# Patient Record
Sex: Female | Born: 1970 | Race: Black or African American | Hispanic: No | Marital: Single
Health system: Southern US, Community
[De-identification: ages and names within clinical notes are randomized; demographics above are authoritative.]

---

## 1998-02-07 ENCOUNTER — Emergency Department (HOSPITAL_COMMUNITY): Admission: EM | Admit: 1998-02-07 | Discharge: 1998-02-07 | Payer: Self-pay | Admitting: *Deleted

## 1998-02-09 ENCOUNTER — Emergency Department (HOSPITAL_COMMUNITY): Admission: EM | Admit: 1998-02-09 | Discharge: 1998-02-09 | Payer: Self-pay | Admitting: Emergency Medicine

## 2003-12-22 ENCOUNTER — Emergency Department (HOSPITAL_COMMUNITY): Admission: EM | Admit: 2003-12-22 | Discharge: 2003-12-22 | Payer: Self-pay | Admitting: *Deleted

## 2017-05-08 ENCOUNTER — Ambulatory Visit: Payer: Self-pay | Admitting: Physical Therapy

## 2019-02-22 ENCOUNTER — Other Ambulatory Visit: Payer: Self-pay | Admitting: Family Medicine

## 2019-02-22 DIAGNOSIS — N23 Unspecified renal colic: Secondary | ICD-10-CM

## 2019-02-23 ENCOUNTER — Other Ambulatory Visit: Payer: Self-pay

## 2019-03-03 ENCOUNTER — Other Ambulatory Visit: Payer: Self-pay

## 2019-05-04 ENCOUNTER — Other Ambulatory Visit: Payer: Self-pay

## 2019-05-04 ENCOUNTER — Encounter: Payer: Self-pay | Admitting: Family Medicine

## 2019-05-04 ENCOUNTER — Ambulatory Visit: Payer: Self-pay | Admitting: Sports Medicine

## 2019-05-04 ENCOUNTER — Ambulatory Visit (INDEPENDENT_AMBULATORY_CARE_PROVIDER_SITE_OTHER): Payer: Managed Care, Other (non HMO) | Admitting: Family Medicine

## 2019-05-04 VITALS — BP 124/86 | Ht 63.5 in | Wt 235.0 lb

## 2019-05-04 DIAGNOSIS — M25552 Pain in left hip: Secondary | ICD-10-CM | POA: Diagnosis not present

## 2019-05-04 NOTE — Progress Notes (Signed)
PCP: Lujean Amel, MD  Subjective:   HPI: Patient is a 48 y.o. female here for left hip pain.  Patient reports for about 1 month she's had consistent lateral left hip/gluteal pain. No acute injury or trauma. Sometimes travels below the knee but mainly stays from hip/glute into the thigh area lateral to anterior. Pain can get up to a 7/10 and sharp. Some numbness but occurs in parts and not consistent. advil has helped with this. Has not tried any exercises/stretches for this. No back pain though she's had this in the past. No skin changes.  History reviewed. No pertinent past medical history.  Current Outpatient Medications on File Prior to Visit  Medication Sig Dispense Refill  . Lancets (ONETOUCH DELICA PLUS [id number]) MISC     . metFORMIN (GLUCOPHAGE-XR) 500 MG 24 hr tablet     . tamsulosin (FLOMAX) 0.4 MG CAPS capsule Take 0.4 mg by mouth daily.     No current facility-administered medications on file prior to visit.     History reviewed. No pertinent surgical history.  Allergies  Allergen Reactions  . Latex Itching  . Penicillins Other (See Comments)    Red, watery eyes  . Sulfa Antibiotics     Red, watery eyes    Social History   Socioeconomic History  . Marital status: Single    Spouse name: Not on file  . Number of children: Not on file  . Years of education: Not on file  . Highest education level: Not on file  Occupational History  . Not on file  Social Needs  . Financial resource strain: Not on file  . Food insecurity    Worry: Not on file    Inability: Not on file  . Transportation needs    Medical: Not on file    Non-medical: Not on file  Tobacco Use  . Smoking status: Not on file  Substance and Sexual Activity  . Alcohol use: Not on file  . Drug use: Not on file  . Sexual activity: Not on file  Lifestyle  . Physical activity    Days per week: Not on file    Minutes per session: Not on file  . Stress: Not on file  Relationships  .  Social Herbalist on phone: Not on file    Gets together: Not on file    Attends religious service: Not on file    Active member of club or organization: Not on file    Attends meetings of clubs or organizations: Not on file    Relationship status: Not on file  . Intimate partner violence    Fear of current or ex partner: Not on file    Emotionally abused: Not on file    Physically abused: Not on file    Forced sexual activity: Not on file  Other Topics Concern  . Not on file  Social History Narrative  . Not on file    History reviewed. No pertinent family history.  BP 124/86   Ht 5' 3.5" (1.613 m)   Wt 235 lb (106.6 kg)   BMI 40.98 kg/m   Review of Systems: See HPI above.     Objective:  Physical Exam:  Gen: NAD, comfortable in exam room  Back: No gross deformity, scoliosis. No paraspinal TTP .  No midline or bony TTP. FROM without pain. Strength LEs 5/5 all muscle groups.   2+ MSRs in patellar and achilles tendons, equal bilaterally. Negative SLRs. Sensation intact to  light touch bilaterally.  Left hip: No deformity. FROM with 5/5 strength. TTP over piriformis.  Less tenderness over proximal IT band, greater trochanter. NVI distally. Negative logroll bilateral hips Negative fabers and piriformis stretches.  Right hip: No deformity. FROM with 5/5 strength. Mild tenderness to palpation over proximal IT band, greater trochanter.  No tenderness over piriformis. NVI distally.   Assessment & Plan:  1. Left hip pain - patient's exam is reassuring.  No evidence lumbar spine pathology or intraarticular hip pathology.  Consistent with piriformis syndrome with an element of proximal IT band syndrome.  Shown home exercises and stretches to do daily.  Ibuprofen or aleve.  Consider physical therapy.  F/u in 6 weeks.

## 2019-05-04 NOTE — Patient Instructions (Signed)
You have piriformis syndrome, to a lesser extent IT band syndrome. Try to avoid painful activities when possible. Do stretches as directed - do 3 of these and hold for 20-30 seconds twice a day. Strengthening exercises as well. Ibuprofen 600mg  three times a day with food OR aleve 2 tabs twice a day with food for pain and inflammation only as needed. Consider physical therapy if you're struggling Follow up with me in 6 weeks for reevaluation.

## 2020-05-04 ENCOUNTER — Other Ambulatory Visit (HOSPITAL_COMMUNITY)
Admission: RE | Admit: 2020-05-04 | Discharge: 2020-05-04 | Disposition: A | Discharge status: Home / Self Care | Payer: 59 | Source: Ambulatory Visit | Attending: Family Medicine | Admitting: Family Medicine

## 2020-05-04 ENCOUNTER — Other Ambulatory Visit: Payer: Self-pay | Admitting: Family Medicine

## 2020-05-04 DIAGNOSIS — Z01411 Encounter for gynecological examination (general) (routine) with abnormal findings: Secondary | ICD-10-CM | POA: Insufficient documentation

## 2020-05-07 LAB — CYTOLOGY - PAP
Comment: NEGATIVE
Diagnosis: NEGATIVE
High risk HPV: NEGATIVE

## 2020-07-20 ENCOUNTER — Other Ambulatory Visit: Payer: Self-pay | Admitting: Family Medicine

## 2020-07-20 DIAGNOSIS — R1031 Right lower quadrant pain: Secondary | ICD-10-CM

## 2020-08-10 ENCOUNTER — Other Ambulatory Visit: Payer: Self-pay

## 2020-08-10 ENCOUNTER — Ambulatory Visit
Admission: RE | Admit: 2020-08-10 | Discharge: 2020-08-10 | Disposition: A | Discharge status: Home / Self Care | Payer: 59 | Source: Ambulatory Visit | Attending: Family Medicine | Admitting: Family Medicine

## 2020-08-10 DIAGNOSIS — R1031 Right lower quadrant pain: Secondary | ICD-10-CM

## 2020-08-10 MED ORDER — IOPAMIDOL (ISOVUE-300) INJECTION 61%
100.0000 mL | Freq: Once | INTRAVENOUS | Status: AC | PRN
  Administered 2020-08-10: 100 mL via INTRAVENOUS

## 2020-11-27 ENCOUNTER — Other Ambulatory Visit: Payer: Self-pay | Admitting: Family Medicine

## 2020-11-27 DIAGNOSIS — R221 Localized swelling, mass and lump, neck: Secondary | ICD-10-CM

## 2020-12-06 ENCOUNTER — Ambulatory Visit
Admission: RE | Admit: 2020-12-06 | Discharge: 2020-12-06 | Disposition: A | Discharge status: Home / Self Care | Payer: 59 | Source: Ambulatory Visit | Attending: Family Medicine | Admitting: Family Medicine

## 2020-12-06 ENCOUNTER — Encounter (INDEPENDENT_AMBULATORY_CARE_PROVIDER_SITE_OTHER): Payer: Self-pay

## 2020-12-06 DIAGNOSIS — R221 Localized swelling, mass and lump, neck: Secondary | ICD-10-CM

## 2020-12-20 ENCOUNTER — Other Ambulatory Visit: Payer: Self-pay | Admitting: Family Medicine

## 2020-12-20 DIAGNOSIS — D110 Benign neoplasm of parotid gland: Secondary | ICD-10-CM

## 2020-12-28 ENCOUNTER — Encounter: Payer: Self-pay | Admitting: Radiology

## 2020-12-28 ENCOUNTER — Ambulatory Visit
Admission: RE | Admit: 2020-12-28 | Discharge: 2020-12-28 | Disposition: A | Discharge status: Home / Self Care | Payer: 59 | Source: Ambulatory Visit | Attending: Family Medicine | Admitting: Family Medicine

## 2020-12-28 DIAGNOSIS — D110 Benign neoplasm of parotid gland: Secondary | ICD-10-CM

## 2020-12-28 MED ORDER — IOPAMIDOL (ISOVUE-300) INJECTION 61%
75.0000 mL | Freq: Once | INTRAVENOUS | Status: AC | PRN
  Administered 2020-12-28: 75 mL via INTRAVENOUS

## 2022-05-28 DIAGNOSIS — E7800 Pure hypercholesterolemia, unspecified: Secondary | ICD-10-CM | POA: Diagnosis not present

## 2022-05-28 DIAGNOSIS — E1165 Type 2 diabetes mellitus with hyperglycemia: Secondary | ICD-10-CM | POA: Diagnosis not present

## 2022-05-28 DIAGNOSIS — M5431 Sciatica, right side: Secondary | ICD-10-CM | POA: Diagnosis not present

## 2022-05-28 DIAGNOSIS — Z0000 Encounter for general adult medical examination without abnormal findings: Secondary | ICD-10-CM | POA: Diagnosis not present

## 2022-05-28 DIAGNOSIS — Z1211 Encounter for screening for malignant neoplasm of colon: Secondary | ICD-10-CM | POA: Diagnosis not present

## 2022-05-28 DIAGNOSIS — L732 Hidradenitis suppurativa: Secondary | ICD-10-CM | POA: Diagnosis not present

## 2022-07-24 IMAGING — CT CT NECK W/ CM
5 of 6 series · 14 of 35 positions shown, 16 images · IV contrast (iopamidol)
Comparison: Ultrasound 12/06/2020

CLINICAL DATA: Left neck mass. Ultrasound showed left parotid
abnormalities.

EXAM:
CT NECK WITH CONTRAST
TECHNIQUE: Multidetector CT imaging of the neck was performed using the
standard protocol following the bolus administration of intravenous
contrast.
CONTRAST:  75mL CK9UU0-RFF IOPAMIDOL (CK9UU0-RFF) INJECTION 61%

[Series 2: neck 2.00 br40 s3 st/ no angle · axial · 0.54mm/px · z∈[-719,-645]mm · 2 of 111 slices shown, 3 images]
[im 37/111  soft-tissue]
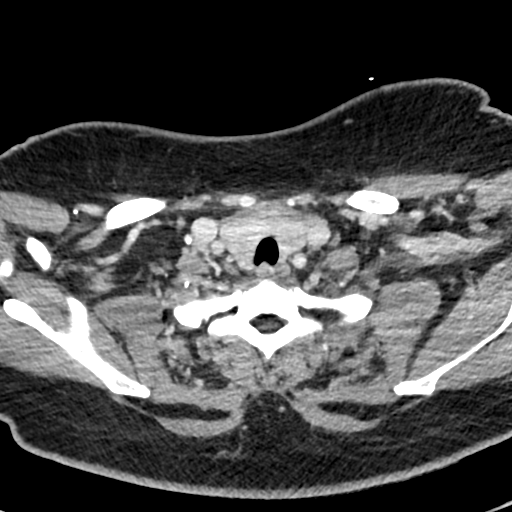
[im 37/111  bone]
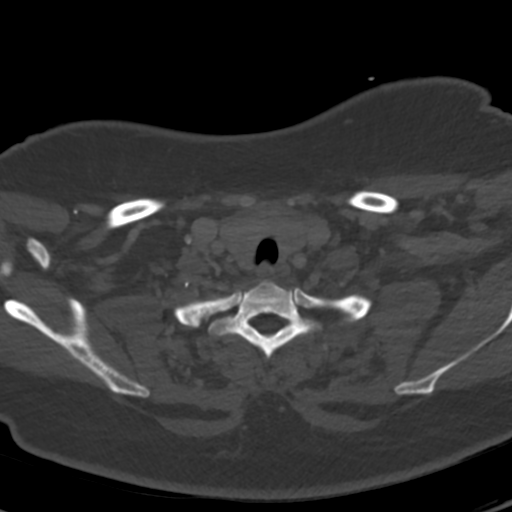
[im 74/111  bone]
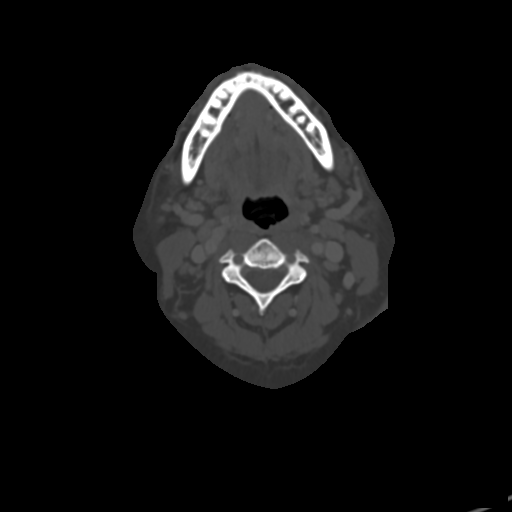

[Series 4: neck 2.00 br60 s3 bone/ no angle · axial · 0.54mm/px · z∈[-719,-645]mm · 2 of 111 slices shown]
[im 37/111  bone]
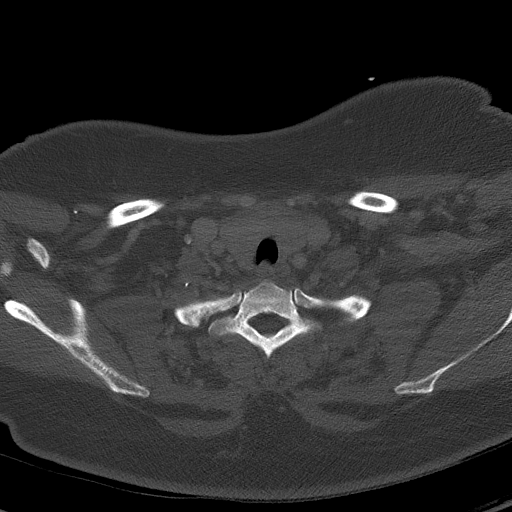
[im 74/111  bone]
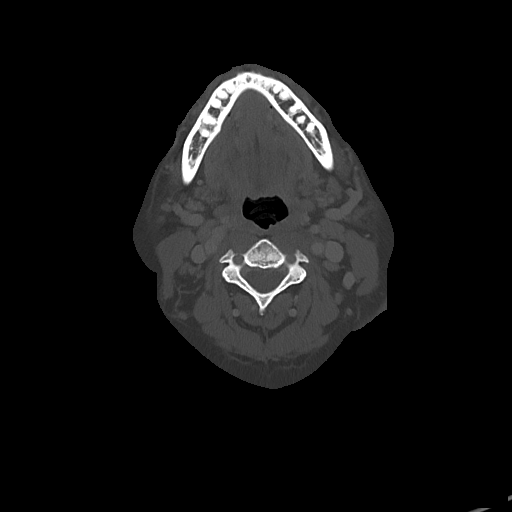

[Series 6: neck 2.00 br36 s3 angled axial (person_name) · axial · 0.54mm/px · z∈[-719,-645]mm · 2 of 111 slices shown]
[im 37/111  bone]
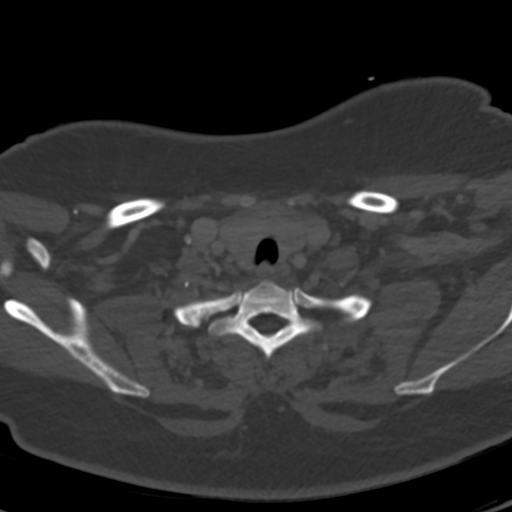
[im 74/111  bone]
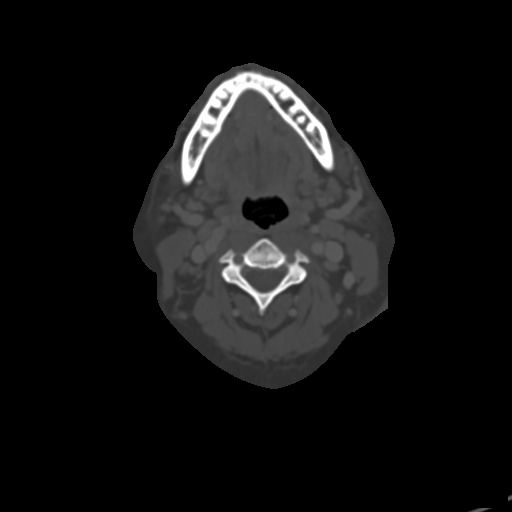

[Series 10: neck 2.00 br40 s3 (person_name) · coronal · 0.44mm/px · 3 of 138 slices shown (1 of 2)]
[im 28/138  bone]
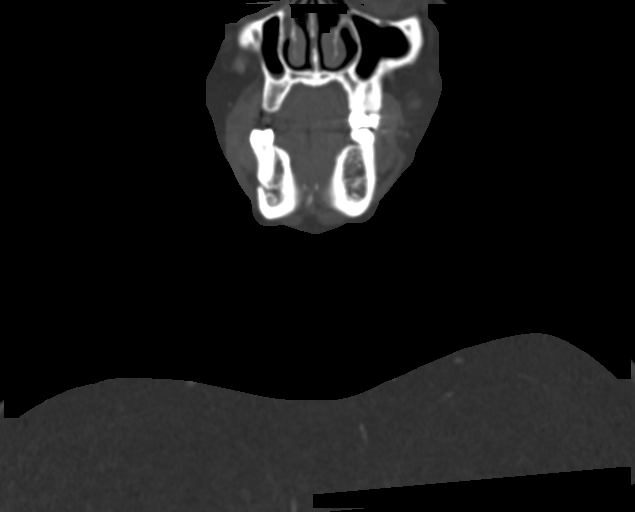
[im 55/138  bone]
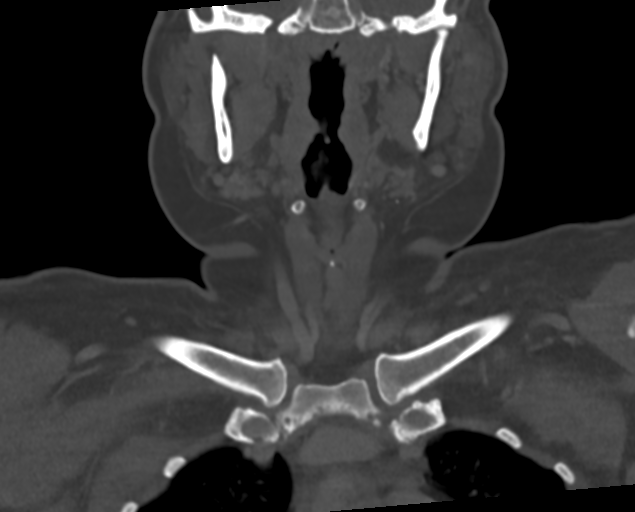
[im 83/138  bone]
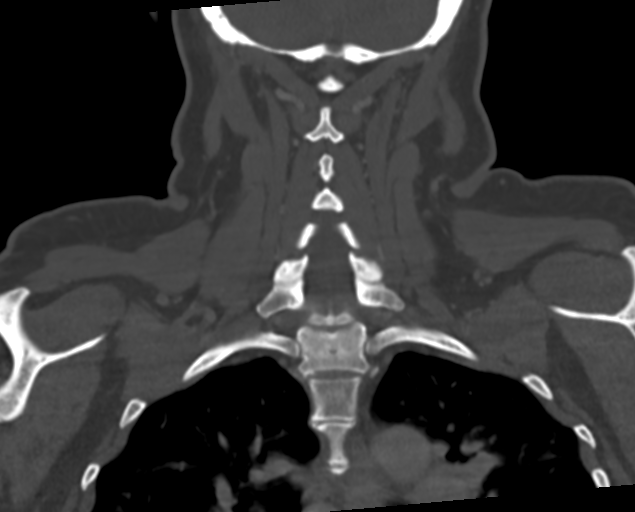

[Series 12: neck 2.00 br40 s3 (person_name) · sagittal · 0.44mm/px · 5 of 138 slices shown, 6 images (2 of 2)]
[im 46/138  bone]
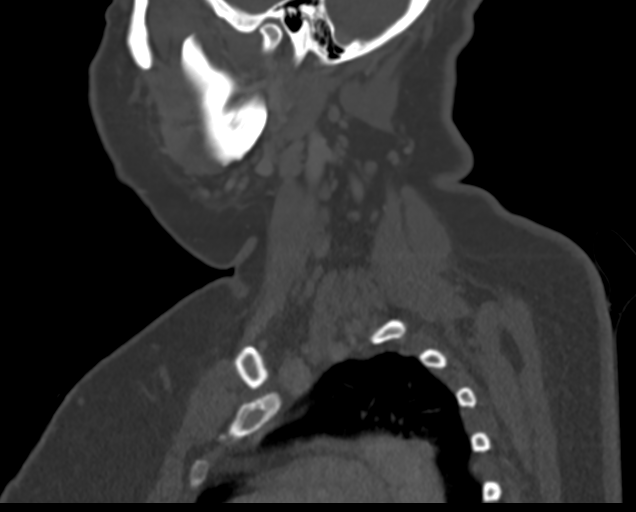
[im 58/138  bone]
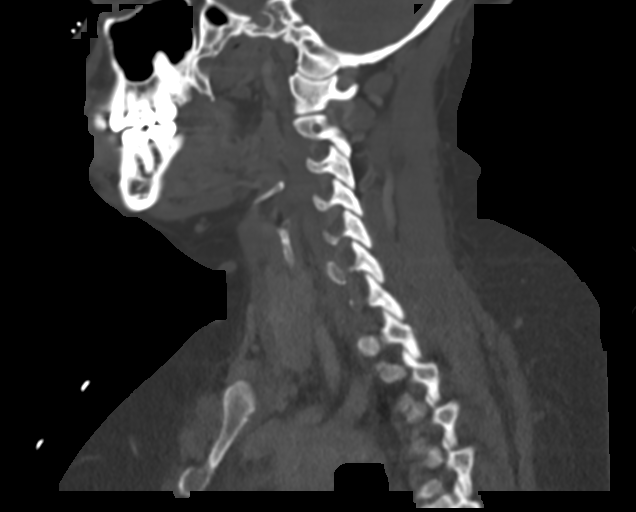
[im 69/138  soft-tissue]
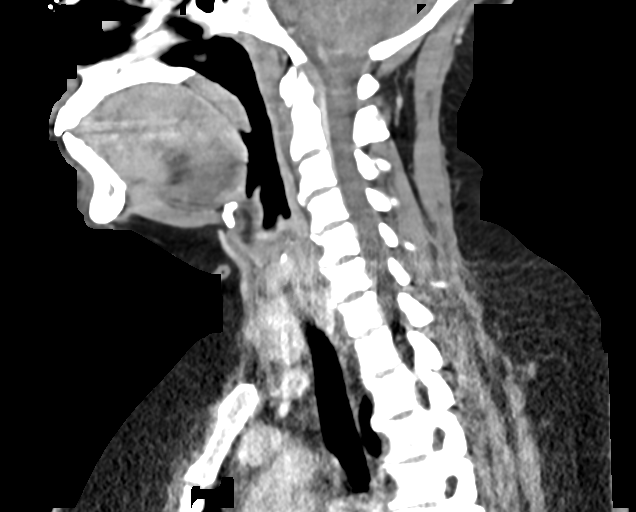
[im 69/138  bone]
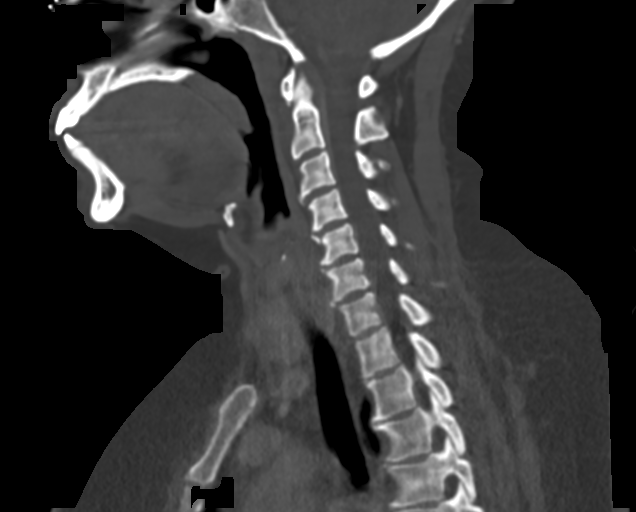
[im 80/138  bone]
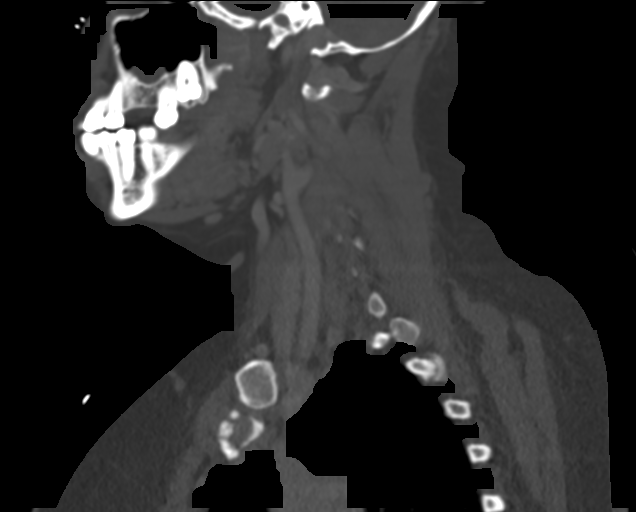
[im 92/138  bone]
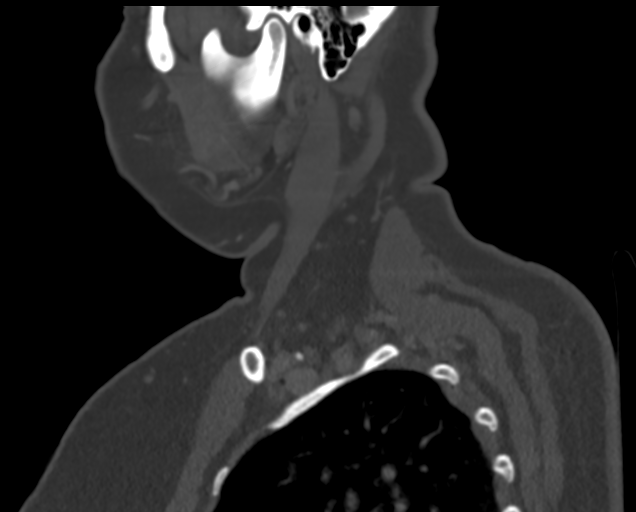

[14 of 35 positions shown; findings below may reference images not displayed]

FINDINGS: Pharynx and larynx: No mucosal or submucosal mass.

Salivary glands: Submandibular glands are normal. Right parotid
gland is normal. At the posterior margin of the superficial lobe of
the left parotid, there is a slightly lobular grouping solid lesions
corresponding to the ultrasound abnormality. These are difficult to
measure but in total measure about 1 x 2 cm. Most likely diagnosis
is small Warthin's tumors. The appearance is not aggressive. This
could be followed clinically for change over time. Few unimportant
punctate parotid calcifications are noted.

Thyroid: Normal

Lymph nodes: No abnormal nodes on either side of the neck.

Vascular: Normal

Limited intracranial: Normal

Visualized orbits: Normal

Mastoids and visualized paranasal sinuses: Clear

Skeleton: Normal

Upper chest: Normal

Other: None
IMPRESSION: Small grouping of solid homogeneous lesions at the posteroinferior
corner of the left parotid gland, measuring in total about 1 x 2 cm.
As suggested by ultrasound, most likely diagnosis is small grouping
of Warthin's tumors. Small lymph nodes are possible but felt less
likely. No aggressive feature by imaging.

## 2023-02-19 DIAGNOSIS — S161XXA Strain of muscle, fascia and tendon at neck level, initial encounter: Secondary | ICD-10-CM | POA: Diagnosis not present

## 2023-04-08 DIAGNOSIS — U071 COVID-19: Secondary | ICD-10-CM | POA: Diagnosis not present

## 2023-06-02 ENCOUNTER — Other Ambulatory Visit (HOSPITAL_BASED_OUTPATIENT_CLINIC_OR_DEPARTMENT_OTHER): Payer: Self-pay | Admitting: Family Medicine

## 2023-06-02 DIAGNOSIS — E785 Hyperlipidemia, unspecified: Secondary | ICD-10-CM

## 2023-07-09 DIAGNOSIS — E119 Type 2 diabetes mellitus without complications: Secondary | ICD-10-CM | POA: Diagnosis not present

## 2023-07-09 DIAGNOSIS — H2513 Age-related nuclear cataract, bilateral: Secondary | ICD-10-CM | POA: Diagnosis not present

## 2023-07-09 DIAGNOSIS — H35343 Macular cyst, hole, or pseudohole, bilateral: Secondary | ICD-10-CM | POA: Diagnosis not present

## 2023-08-28 DIAGNOSIS — H35343 Macular cyst, hole, or pseudohole, bilateral: Secondary | ICD-10-CM | POA: Diagnosis not present

## 2023-08-28 DIAGNOSIS — H25811 Combined forms of age-related cataract, right eye: Secondary | ICD-10-CM | POA: Diagnosis not present

## 2023-08-28 DIAGNOSIS — H25812 Combined forms of age-related cataract, left eye: Secondary | ICD-10-CM | POA: Diagnosis not present

## 2023-09-18 DIAGNOSIS — Z01818 Encounter for other preprocedural examination: Secondary | ICD-10-CM | POA: Diagnosis not present

## 2023-09-18 DIAGNOSIS — H25811 Combined forms of age-related cataract, right eye: Secondary | ICD-10-CM | POA: Diagnosis not present

## 2023-09-28 DIAGNOSIS — H25811 Combined forms of age-related cataract, right eye: Secondary | ICD-10-CM | POA: Diagnosis not present

## 2023-09-28 DIAGNOSIS — E1136 Type 2 diabetes mellitus with diabetic cataract: Secondary | ICD-10-CM | POA: Diagnosis not present

## 2024-03-03 DIAGNOSIS — M25512 Pain in left shoulder: Secondary | ICD-10-CM | POA: Diagnosis not present

## 2024-04-25 DIAGNOSIS — Z008 Encounter for other general examination: Secondary | ICD-10-CM | POA: Diagnosis not present
